# Patient Record
Sex: Male | Born: 1960 | Race: White | Hispanic: No | Marital: Married | Smoking: Never smoker
Health system: Southern US, Community
[De-identification: ages and names within clinical notes are randomized; demographics above are authoritative.]

## PROBLEM LIST (undated history)

## (undated) DIAGNOSIS — J45909 Unspecified asthma, uncomplicated: Secondary | ICD-10-CM

## (undated) DIAGNOSIS — T7840XA Allergy, unspecified, initial encounter: Secondary | ICD-10-CM

## (undated) DIAGNOSIS — E079 Disorder of thyroid, unspecified: Secondary | ICD-10-CM

## (undated) HISTORY — PX: APPENDECTOMY: SHX54

## (undated) HISTORY — DX: Allergy, unspecified, initial encounter: T78.40XA

## (undated) HISTORY — DX: Disorder of thyroid, unspecified: E07.9

## (undated) HISTORY — PX: HERNIA REPAIR: SHX51

## (undated) HISTORY — DX: Unspecified asthma, uncomplicated: J45.909

---

## 1998-12-10 ENCOUNTER — Ambulatory Visit (HOSPITAL_COMMUNITY): Admission: RE | Admit: 1998-12-10 | Discharge: 1998-12-10 | Payer: Self-pay | Admitting: Gastroenterology

## 1998-12-10 ENCOUNTER — Encounter (INDEPENDENT_AMBULATORY_CARE_PROVIDER_SITE_OTHER): Payer: Self-pay | Admitting: Specialist

## 2002-07-13 ENCOUNTER — Encounter: Payer: Self-pay | Admitting: Gastroenterology

## 2002-07-13 ENCOUNTER — Encounter: Admission: RE | Admit: 2002-07-13 | Discharge: 2002-07-13 | Payer: Self-pay | Admitting: Gastroenterology

## 2002-08-03 ENCOUNTER — Encounter: Payer: Self-pay | Admitting: Internal Medicine

## 2002-08-03 ENCOUNTER — Encounter: Admission: RE | Admit: 2002-08-03 | Discharge: 2002-08-03 | Payer: Self-pay | Admitting: Internal Medicine

## 2003-01-30 ENCOUNTER — Encounter: Payer: Self-pay | Admitting: Internal Medicine

## 2003-01-30 ENCOUNTER — Inpatient Hospital Stay (HOSPITAL_COMMUNITY): Admission: AD | Admit: 2003-01-30 | Discharge: 2003-01-31 | Payer: Self-pay | Admitting: Internal Medicine

## 2006-04-27 ENCOUNTER — Ambulatory Visit: Payer: Self-pay | Admitting: Gastroenterology

## 2006-06-03 ENCOUNTER — Emergency Department (HOSPITAL_COMMUNITY): Admission: EM | Admit: 2006-06-03 | Discharge: 2006-06-03 | Payer: Self-pay | Admitting: Emergency Medicine

## 2006-06-24 ENCOUNTER — Ambulatory Visit: Payer: Self-pay | Admitting: Gastroenterology

## 2006-06-29 ENCOUNTER — Ambulatory Visit: Payer: Self-pay | Admitting: Gastroenterology

## 2006-08-03 ENCOUNTER — Ambulatory Visit: Payer: Self-pay | Admitting: Gastroenterology

## 2007-03-17 ENCOUNTER — Ambulatory Visit: Payer: Self-pay | Admitting: Gastroenterology

## 2007-05-19 ENCOUNTER — Ambulatory Visit: Payer: Self-pay | Admitting: Gastroenterology

## 2007-05-26 ENCOUNTER — Ambulatory Visit: Payer: Self-pay | Admitting: Gastroenterology

## 2007-06-23 ENCOUNTER — Ambulatory Visit: Payer: Self-pay | Admitting: Gastroenterology

## 2007-07-04 ENCOUNTER — Ambulatory Visit (HOSPITAL_COMMUNITY): Admission: RE | Admit: 2007-07-04 | Discharge: 2007-07-04 | Payer: Self-pay | Admitting: Gastroenterology

## 2007-07-05 ENCOUNTER — Ambulatory Visit: Payer: Self-pay | Admitting: Gastroenterology

## 2007-08-04 ENCOUNTER — Ambulatory Visit: Payer: Self-pay | Admitting: Gastroenterology

## 2007-09-01 ENCOUNTER — Ambulatory Visit: Payer: Self-pay | Admitting: Gastroenterology

## 2007-09-29 ENCOUNTER — Ambulatory Visit: Payer: Self-pay | Admitting: Gastroenterology

## 2007-10-27 ENCOUNTER — Ambulatory Visit: Payer: Self-pay | Admitting: Gastroenterology

## 2007-11-24 ENCOUNTER — Ambulatory Visit: Payer: Self-pay | Admitting: Gastroenterology

## 2007-12-09 ENCOUNTER — Emergency Department (HOSPITAL_COMMUNITY): Admission: EM | Admit: 2007-12-09 | Discharge: 2007-12-09 | Payer: Self-pay | Admitting: Emergency Medicine

## 2008-01-26 ENCOUNTER — Ambulatory Visit: Payer: Self-pay | Admitting: Gastroenterology
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
## 2008-04-26 ENCOUNTER — Ambulatory Visit: Payer: Self-pay | Admitting: Gastroenterology

## 2009-03-23 IMAGING — US US ABDOMEN COMPLETE
1 series · 13 of 25 positions shown · non-contrast
Comparison: Ultrasound, 07/13/02.

CLINICAL DATA: Hepatitis C.  Evaluate for gallstones.
 ABDOMEN ULTRASOUND COMPLETE ? 07/04/07:
TECHNIQUE: Complete abdominal ultrasound examination was performed including evaluation of the liver, gallbladder, bile ducts, pancreas, kidneys, spleen, IVC, and abdominal aorta.

[Series 1: unknown · 0.35mm/px · 13 of 57 slices shown]
[im 1/57]
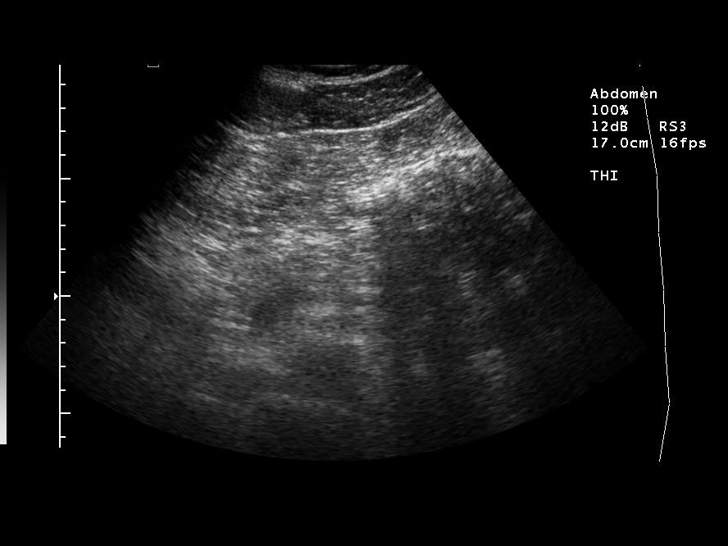
[im 5/57]
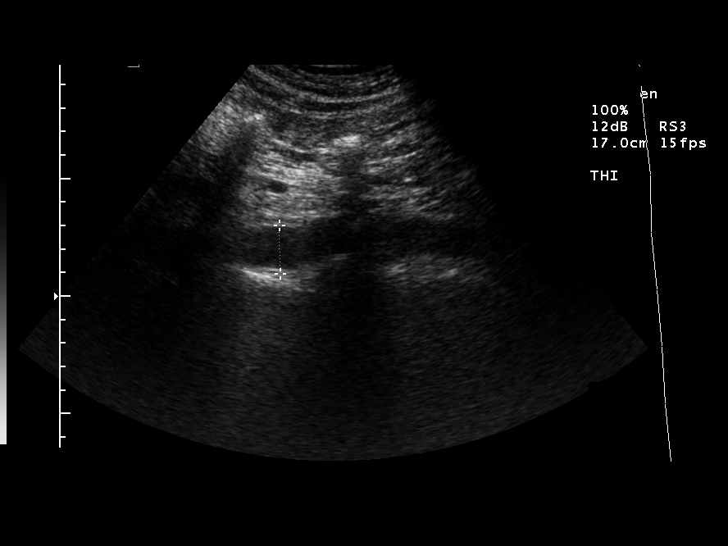
[im 10/57]
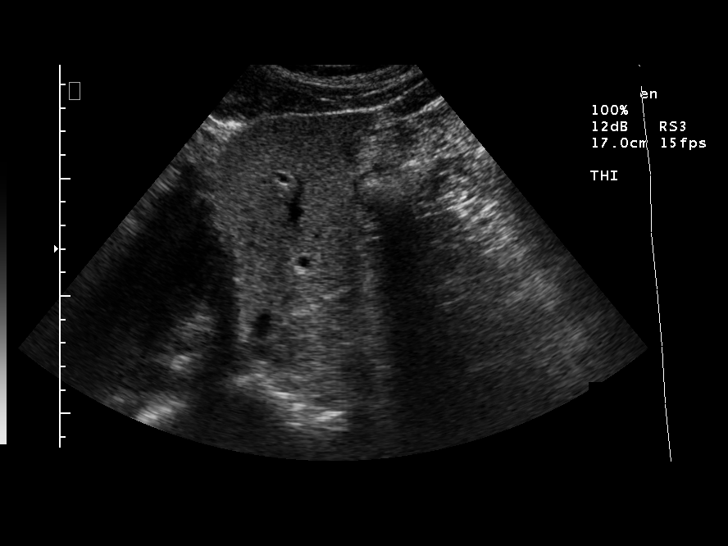
[im 15/57]
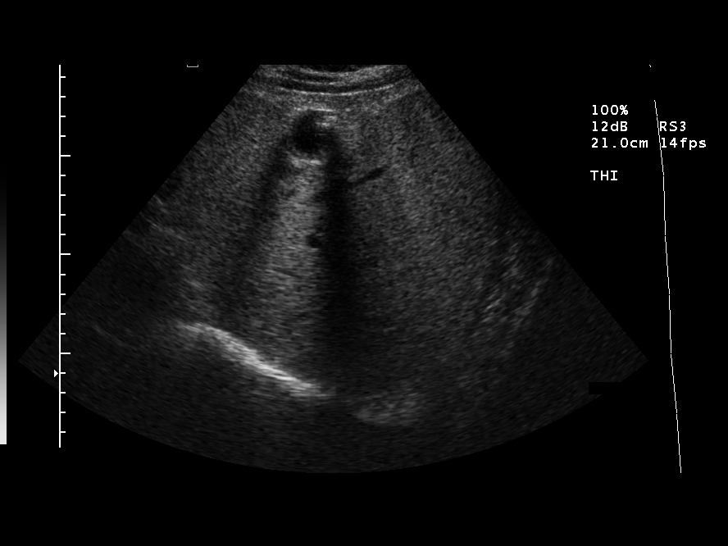
[im 19/57]
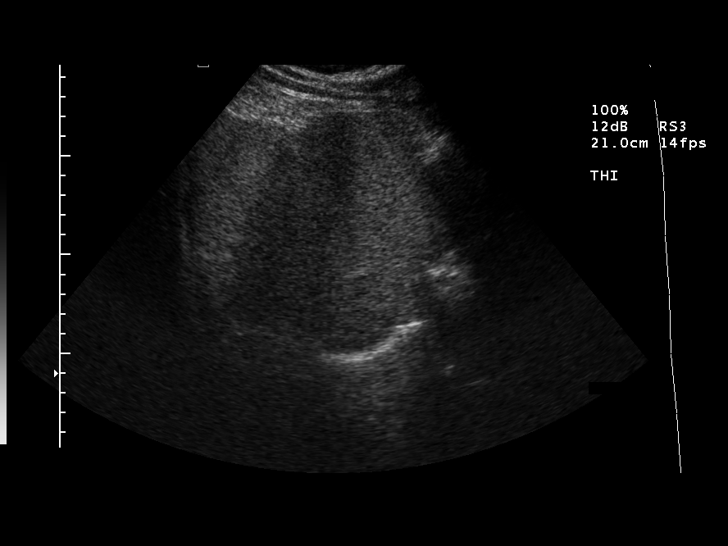
[im 24/57]
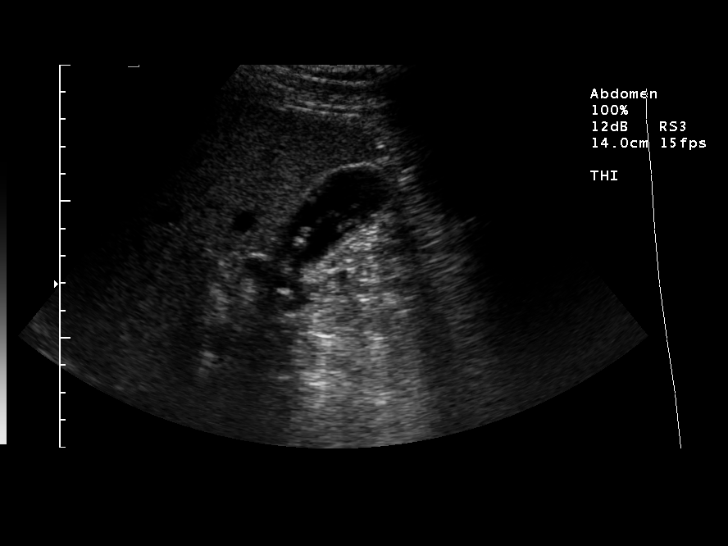
[im 29/57]
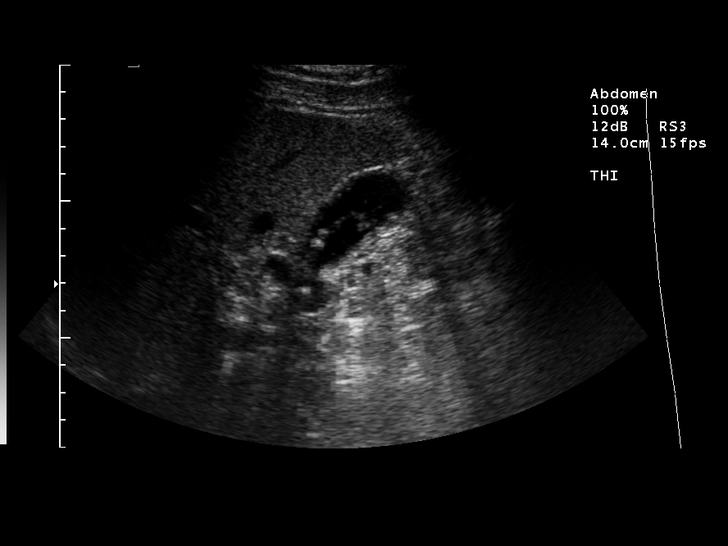
[im 33/57]
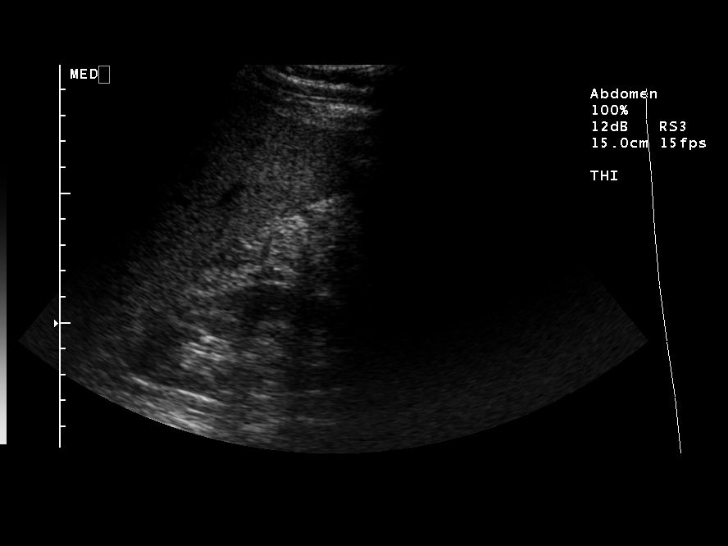
[im 38/57]
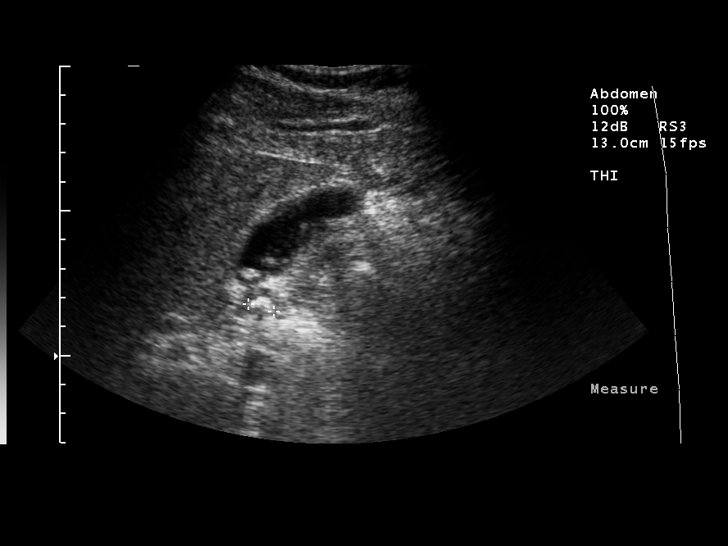
[im 43/57]
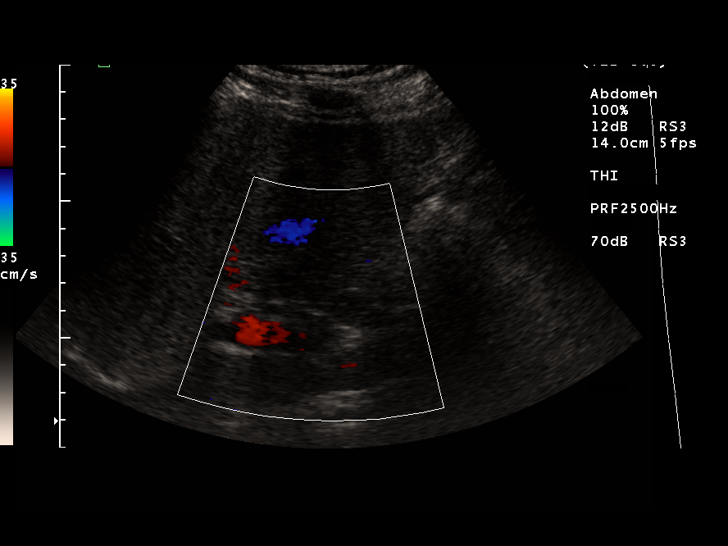
[im 47/57]
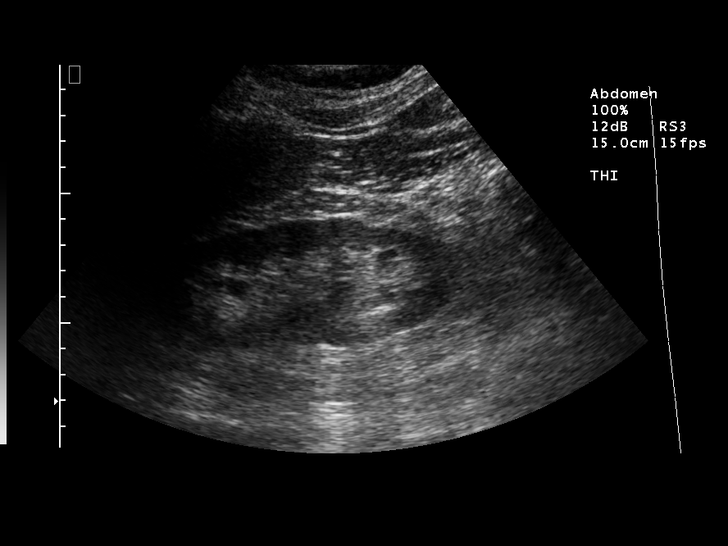
[im 52/57]
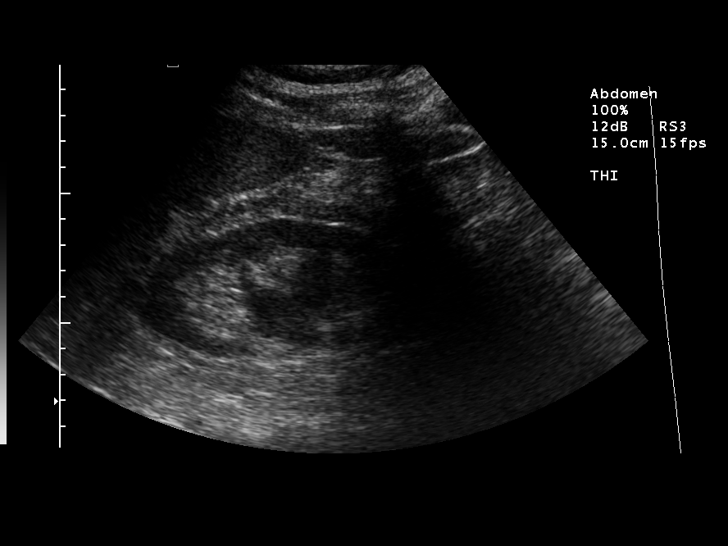
[im 57/57]
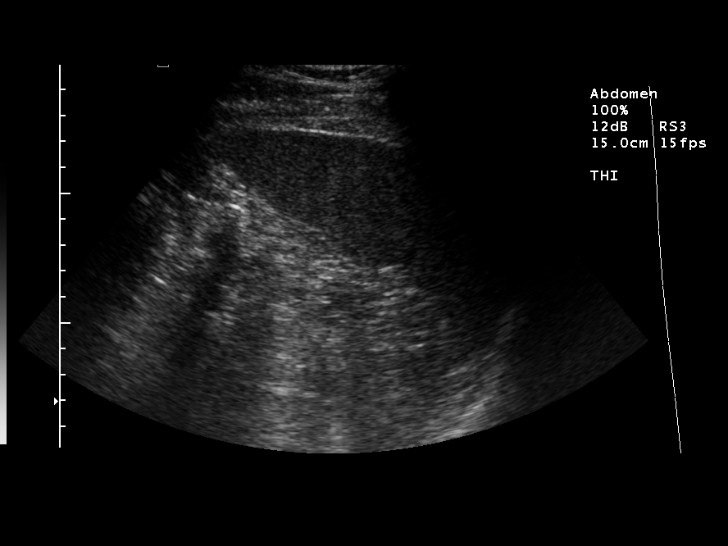

[13 of 25 positions shown; findings below may reference images not displayed]

FINDINGS: As noted on the study July 2002, multiple gallstones are noted within the gallbladder.  One remains in the neck of the gallbladder.  The gallbladder is minimally prominent but no pericholecystic fluid is seen and no pain is present over the gallbladder directly.  The liver is echogenic consistent with fatty infiltration.  The common bile duct is normal measuring 4.5 mm in diameter.  The IVC and pancreas appear normal with the spleen prominent measuring 13.6 cm sagittally.  No hydronephrosis is noted. The right kidney measures 10.2 cm sagittally with the left kidney measuring 12.0 cm.  The abdominal aorta is normal in caliber.
IMPRESSION: 1.  Multiple gallstones, one of which appears to remain in the neck of the gallbladder. The gallbladder wall is slightly prominent but no pain is present. 
 2.  Fatty infiltration of the liver.
 3.  Spleen upper normal.

## 2010-03-27 ENCOUNTER — Ambulatory Visit (HOSPITAL_COMMUNITY): Admission: RE | Admit: 2010-03-27 | Discharge: 2010-03-27 | Payer: Self-pay | Admitting: Gastroenterology

## 2010-09-26 NOTE — H&P (Signed)
NAME:  Justin Maddox, Justin Maddox                       ACCOUNT NO.:  [id number]   MEDICAL RECORD NO.:  [id number]                   PATIENT TYPE:  INP   LOCATION:  4728                                 FACILITY:  [facility]   PHYSICIAN:  Georgann Housekeeper, M.D.                 DATE OF BIRTH:  [birth year]   DATE OF ADMISSION:  01/30/2003  DATE OF DISCHARGE:                                HISTORY & PHYSICAL   CHIEF COMPLAINT:  Chest pain and left arm pain.   HISTORY OF PRESENT ILLNESS:  [age]-year-old male with history of mild  hyperlipidemia, chronic hepatitis C, anxiety, who came in with an episode of  chest pain that started today, around midmorning.  He said he a usual day  when he woke up, had breakfast and then later in the day, started having  some discomfort; he did not pay attention initially, had thought it was a  little bit of indigestion and the pain came on and off, is substernal, feels  like a pressure, a little bit of shortness of breath with this; no nausea or  vomiting, no diaphoresis.  He started having later, left arm discomfort and  came to the clinic with some discomfort in the left side of the chest. He  said he was feeling better and in the clinic, he had some EKG changes  laterally and ischemic, admitted for further evaluation.   PAST MEDICAL HISTORY:  1. Hypothyroidism.  2. Chronic hepatitis C.  3. Mild asthma and allergy.  4. Anxiety disorder.  5. Occasional knee problem.   MEDICATIONS:  1. Wellbutrin SR 150 mg b.i.d.  2. Serevent inhaler as needed (p.r.n.).  3. Synthroid 0.05 mg daily.   ALLERGIES:  Allergies to PENICILLIN and FLOXINS give him headache.   PAST SURGICAL HISTORY:  1. Right inguinal hernia repair.  2. Status post appendectomy.  3. Status post hemorrhoidectomy.   FAMILY HISTORY:  Negative for coronary artery disease.   SOCIAL HISTORY:  No tobacco, no alcohol and married.  He owns his own  business, Orthoptist, and fairly physically active; he  plays softball and  baseball regularly.   PHYSICAL EXAMINATION:  VITAL SIGNS:  Temperature 98.1, blood pressure  130/80, pulse 72, respirations 16.  GENERAL:  Well-appearing male in no acute distress.  LUNGS:  Lungs were clear.  HEENT:  Oropharynx was clear.  NECK:  No JVD or bruits.  CARDIAC:  Regular, S1 and S2, without any murmurs.  ABDOMEN:  Abdomen was soft without any tenderness.  EXTREMITIES:  There was no edema.   LABORATORY AND ACCESSORY CLINICAL DATA:  EKG showed normal sinus rhythm with  53 beats, with lateral ischemic inverted T waves in lateral leads, new since  1995 EKG.   Lab work and x-rays are pending.   IMPRESSION:  [age]-year-old male with mild hyperlipidemia, with  chronic hepatitis C and comes in with chest pain and left arm pain on  and  off during the day with some electrocardiographic changes.   PLAN:  1. Chest pain:  We will admit for telemetry, rule him out for myocardial     infarction, rule out for unstable angina, check some cardiac enzymes and     chest x-ray; put him on Lovenox, aspirin and beta blocker.  If he still     has the discomfort in the chest or arm, we might consider nitroglycerin     drip.  Cardiology consult.  He probably will need a cardiac stress test,     if he rules out.  2. Anxiety:  Continue on Wellbutrin.  3. Hypothyroid:  Continue on Synthroid.  4. I discussed this with cardiology for a consult.                                                Georgann Housekeeper, M.D.    Arliss Journey  D:  01/30/2003  T:  01/31/2003  Job:  [id number]

## 2011-02-27 ENCOUNTER — Emergency Department (HOSPITAL_COMMUNITY)
Admission: EM | Admit: 2011-02-27 | Discharge: 2011-02-27 | Disposition: A | Discharge status: Home / Self Care | Payer: BC Managed Care – PPO | Attending: Emergency Medicine | Admitting: Emergency Medicine

## 2011-02-27 DIAGNOSIS — R1013 Epigastric pain: Secondary | ICD-10-CM | POA: Insufficient documentation

## 2011-02-27 DIAGNOSIS — R110 Nausea: Secondary | ICD-10-CM | POA: Insufficient documentation

## 2011-02-27 DIAGNOSIS — J45909 Unspecified asthma, uncomplicated: Secondary | ICD-10-CM | POA: Insufficient documentation

## 2011-02-27 DIAGNOSIS — K8020 Calculus of gallbladder without cholecystitis without obstruction: Secondary | ICD-10-CM | POA: Insufficient documentation

## 2011-02-27 DIAGNOSIS — E039 Hypothyroidism, unspecified: Secondary | ICD-10-CM | POA: Insufficient documentation

## 2011-02-27 LAB — CBC
HCT: 44.7 % (ref 39.0–52.0)
MCH: 34.9 pg — ABNORMAL HIGH (ref 26.0–34.0)
MCV: 97 fL (ref 78.0–100.0)
Platelets: 171 10*3/uL (ref 150–400)
RDW: 12.1 % (ref 11.5–15.5)
WBC: 9.2 10*3/uL (ref 4.0–10.5)

## 2011-02-27 LAB — DIFFERENTIAL
Basophils Absolute: 0 10*3/uL (ref 0.0–0.1)
Basophils Relative: 0 % (ref 0–1)
Eosinophils Relative: 2 % (ref 0–5)
Lymphocytes Relative: 18 % (ref 12–46)
Monocytes Absolute: 0.7 10*3/uL (ref 0.1–1.0)
Monocytes Relative: 8 % (ref 3–12)
Neutro Abs: 6.7 10*3/uL (ref 1.7–7.7)

## 2011-02-27 LAB — COMPREHENSIVE METABOLIC PANEL
Albumin: 4.5 g/dL (ref 3.5–5.2)
Alkaline Phosphatase: 51 U/L (ref 39–117)
BUN: 25 mg/dL — ABNORMAL HIGH (ref 6–23)
Chloride: 102 mEq/L (ref 96–112)
Glucose, Bld: 100 mg/dL — ABNORMAL HIGH (ref 70–99)
Sodium: 137 mEq/L (ref 135–145)
Total Bilirubin: 0.4 mg/dL (ref 0.3–1.2)

## 2012-02-11 ENCOUNTER — Ambulatory Visit (HOSPITAL_COMMUNITY)
Admission: RE | Admit: 2012-02-11 | Discharge: 2012-02-11 | Disposition: A | Discharge status: Home / Self Care | Payer: BC Managed Care – PPO | Source: Ambulatory Visit | Attending: Internal Medicine | Admitting: Internal Medicine

## 2012-02-11 DIAGNOSIS — J45909 Unspecified asthma, uncomplicated: Secondary | ICD-10-CM | POA: Insufficient documentation

## 2012-02-11 MED ORDER — ALBUTEROL SULFATE (5 MG/ML) 0.5% IN NEBU
2.5000 mg | INHALATION_SOLUTION | Freq: Once | RESPIRATORY_TRACT | Status: AC
  Administered 2012-02-11: 2.5 mg via RESPIRATORY_TRACT

## 2014-12-27 ENCOUNTER — Ambulatory Visit (INDEPENDENT_AMBULATORY_CARE_PROVIDER_SITE_OTHER): Payer: BLUE CROSS/BLUE SHIELD

## 2014-12-27 ENCOUNTER — Ambulatory Visit (INDEPENDENT_AMBULATORY_CARE_PROVIDER_SITE_OTHER): Payer: BLUE CROSS/BLUE SHIELD | Admitting: Family Medicine

## 2014-12-27 VITALS — BP 136/74 | HR 59 | Temp 98.3°F | Resp 18 | Ht 74.0 in | Wt 204.0 lb

## 2014-12-27 DIAGNOSIS — M7989 Other specified soft tissue disorders: Secondary | ICD-10-CM | POA: Diagnosis not present

## 2014-12-27 DIAGNOSIS — M79672 Pain in left foot: Secondary | ICD-10-CM

## 2014-12-27 DIAGNOSIS — S92532A Displaced fracture of distal phalanx of left lesser toe(s), initial encounter for closed fracture: Secondary | ICD-10-CM

## 2014-12-27 DIAGNOSIS — S92912A Unspecified fracture of left toe(s), initial encounter for closed fracture: Secondary | ICD-10-CM

## 2014-12-27 MED ORDER — NAPROXEN 500 MG PO TABS: 500.0000 mg | ORAL_TABLET | Freq: Two times a day (BID) | ORAL | Status: AC

## 2014-12-27 NOTE — Progress Notes (Signed)
Chief Complaint:  Chief Complaint  Patient presents with  . Foot Swelling    left foot, started tuesday     HPI: Justin Maddox is a 54 y.o. male who reports to [facility] today complaining of left foot pain after jumping out of bed of pickup truck throwing things in dumpster and attacked by bees, and so jumped off truck bed and landed on his foot and injured his foot. + foot swelling,  Some numbness and tinglign and throbbing and and has been putting weight on it, he ahs been walkin on it. He has no pain on the lateral side of his foot, it is primarily on his medial  Side.  Has tried icing it and elevating it.   Past Medical History  Diagnosis Date  . Allergy   . Asthma   . Thyroid disease    Past Surgical History  Procedure Laterality Date  . Hernia repair    . Appendectomy     Social History   Social History  . Marital Status: Married    Spouse Name: N/A  . Number of Children: N/A  . Years of Education: N/A   Social History Main Topics  . Smoking status: Never Smoker   . Smokeless tobacco: None  . Alcohol Use: 0.0 oz/week    0 Standard drinks or equivalent per week  . Drug Use: None  . Sexual Activity: Not Asked   Other Topics Concern  . None   Social History Narrative  . None   No family history on file. Allergies  Allergen Reactions  . Penicillins Swelling   Prior to Admission medications   Medication Sig Start Date End Date Taking? Authorizing Provider  Levothyroxine Sodium (SYNTHROID PO) Take by mouth.   Yes Historical Provider, MD     ROS: The patient denies fevers, chills, night sweats, unintentional weight loss, chest pain, palpitations, wheezing, dyspnea on exertion, nausea, vomiting, abdominal pain, dysuria, hematuria, melena  All other systems have been reviewed and were otherwise negative with the exception of those mentioned in the HPI and as above.    PHYSICAL EXAM: Filed Vitals:   12/27/14 1230  BP: 136/74  Pulse: 59  Temp: 98.3  F (36.8 C)  Resp: 18   Body mass index is 26.18 kg/(m^2).   General: Alert, no acute distress HEENT:  Normocephalic, atraumatic, oropharynx patent. EOMI, PERRLA Cardiovascular:  Regular rate and rhythm, no rubs murmurs or gallops.  No Carotid bruits, radial pulse intact. No pedal edema.  Respiratory: Clear to auscultation bilaterally.  No wheezes, rales, or rhonchi.  No cyanosis, no use of accessory musculature Abdominal: No organomegaly, abdomen is soft and non-tender, positive bowel sounds. No masses. Skin: No rashes. Neurologic: Facial musculature symmetric. Psychiatric: Patient acts appropriately throughout our interaction. Lymphatic: No cervical or submandibular lymphadenopathy Musculoskeletal: Gait minimally antalgic + tender medial forefoot, nontender 5th toe + selling, + DP and PTA pulse Minimal ecchymosis Full ROM, sensation intact 5/5 strength   LABS: Results for orders placed or performed during the hospital encounter of 02/27/11  Differential  Result Value Ref Range   Neutrophils Relative % 72 43 - 77 %   Neutro Abs 6.7 1.7 - 7.7 K/uL   Lymphocytes Relative 18 12 - 46 %   Lymphs Abs 1.7 0.7 - 4.0 K/uL   Monocytes Relative 8 3 - 12 %   Monocytes Absolute 0.7 0.1 - 1.0 K/uL   Eosinophils Relative 2 0 - 5 %   Eosinophils Absolute  0.2 0.0 - 0.7 K/uL   Basophils Relative 0 0 - 1 %   Basophils Absolute 0.0 0.0 - 0.1 K/uL  CBC  Result Value Ref Range   WBC 9.2 4.0 - 10.5 K/uL   RBC 4.61 4.22 - 5.81 MIL/uL   Hemoglobin 16.1 13.0 - 17.0 g/dL   HCT 04.5 40.9 - 81.1 %   MCV 97.0 78.0 - 100.0 fL   MCH 34.9 (H) 26.0 - 34.0 pg   MCHC 36.0 30.0 - 36.0 g/dL   RDW 91.4 78.2 - 95.6 %   Platelets 171 150 - 400 K/uL  Comprehensive metabolic panel  Result Value Ref Range   Sodium 137 135 - 145 mEq/L   Potassium 4.2 3.5 - 5.1 mEq/L   Chloride 102 96 - 112 mEq/L   CO2 23 19 - 32 mEq/L   Glucose, Bld 100 (H) 70 - 99 mg/dL   BUN 25 (H) 6 - 23 mg/dL   Creatinine, Ser 2.13  0.50 - 1.35 mg/dL   Calcium 9.9 8.4 - 08.6 mg/dL   Total Protein 7.5 6.0 - 8.3 g/dL   Albumin 4.5 3.5 - 5.2 g/dL   AST 44 (H) 0 - 37 U/L   ALT 29 0 - 53 U/L   Alkaline Phosphatase 51 39 - 117 U/L   Total Bilirubin 0.4 0.3 - 1.2 mg/dL   GFR calc non Af Amer 71 (L) >90 mL/min   GFR calc Af Amer 82 (L) >90 mL/min  Lipase, blood  Result Value Ref Range   Lipase 37 11 - 59 U/L     EKG/XRAY:   Primary read interpreted by Dr. Conley Rolls at [facility]. + distal phalanges fracture of 5th toe, please comment if there is any fracture near sesamoid bone, lateral view shows 2 small opaque fragments    ASSESSMENT/PLAN: Encounter Diagnoses  Name Primary?  . Left foot pain Yes  . Toe fracture, left, closed, initial encounter   . Foot swelling    He has medial midfoot pain, he denies having any pain on his left 5th toe, he thinks he may have broken this several months earlier when he stubbed it on something, it does not hurt at all  On exam the lateral side of the foot is not affected. He does not want a post op shoe He will be nonweightbearing for 1 week , RICE IF no improvement then need to get repeat xrays, Sandrea Hughs, stress fracture etc may notbe noticeable on xray until later or may need advance imaging.  He has crutches at home, he has a flat velcro post op like shoe is currently wearing and so will use that  Official xrays d/w patient: IMPRESSION: Slight displaced fracture of the distal tuft of the left fifth digit, otherwise negative exam.   Electronically Signed  By: Maisie Fus Register  On: 12/27/2014 13:08   Gross sideeffects, risk and benefits, and alternatives of medications d/w patient. Patient is aware that all medications have potential sideeffects and we are unable to predict every sideeffect or drug-drug interaction that may occur.  Yobany Vroom DO  01/01/2015 7:53 AM

## 2016-02-17 DIAGNOSIS — E039 Hypothyroidism, unspecified: Secondary | ICD-10-CM | POA: Diagnosis not present

## 2016-02-17 DIAGNOSIS — R7303 Prediabetes: Secondary | ICD-10-CM | POA: Diagnosis not present

## 2016-02-17 DIAGNOSIS — Z0000 Encounter for general adult medical examination without abnormal findings: Secondary | ICD-10-CM | POA: Diagnosis not present

## 2016-02-17 DIAGNOSIS — Z125 Encounter for screening for malignant neoplasm of prostate: Secondary | ICD-10-CM | POA: Diagnosis not present

## 2016-02-17 DIAGNOSIS — M109 Gout, unspecified: Secondary | ICD-10-CM | POA: Diagnosis not present

## 2016-02-17 DIAGNOSIS — Z1389 Encounter for screening for other disorder: Secondary | ICD-10-CM | POA: Diagnosis not present

## 2016-04-09 DIAGNOSIS — D225 Melanocytic nevi of trunk: Secondary | ICD-10-CM | POA: Diagnosis not present

## 2016-04-09 DIAGNOSIS — L918 Other hypertrophic disorders of the skin: Secondary | ICD-10-CM | POA: Diagnosis not present

## 2016-04-09 DIAGNOSIS — L718 Other rosacea: Secondary | ICD-10-CM | POA: Diagnosis not present

## 2016-04-09 DIAGNOSIS — D2262 Melanocytic nevi of left upper limb, including shoulder: Secondary | ICD-10-CM | POA: Diagnosis not present

## 2016-04-09 DIAGNOSIS — D2261 Melanocytic nevi of right upper limb, including shoulder: Secondary | ICD-10-CM | POA: Diagnosis not present

## 2016-04-09 DIAGNOSIS — D2272 Melanocytic nevi of left lower limb, including hip: Secondary | ICD-10-CM | POA: Diagnosis not present

## 2017-01-19 DIAGNOSIS — R110 Nausea: Secondary | ICD-10-CM | POA: Diagnosis not present

## 2017-01-19 DIAGNOSIS — R634 Abnormal weight loss: Secondary | ICD-10-CM | POA: Diagnosis not present

## 2017-01-19 DIAGNOSIS — R630 Anorexia: Secondary | ICD-10-CM | POA: Diagnosis not present

## 2017-01-19 DIAGNOSIS — L299 Pruritus, unspecified: Secondary | ICD-10-CM | POA: Diagnosis not present

## 2017-01-19 DIAGNOSIS — B1920 Unspecified viral hepatitis C without hepatic coma: Secondary | ICD-10-CM | POA: Diagnosis not present

## 2017-01-27 DIAGNOSIS — R945 Abnormal results of liver function studies: Secondary | ICD-10-CM | POA: Diagnosis not present

## 2017-01-29 ENCOUNTER — Other Ambulatory Visit: Payer: Self-pay | Admitting: Internal Medicine

## 2017-01-29 DIAGNOSIS — R7989 Other specified abnormal findings of blood chemistry: Secondary | ICD-10-CM

## 2017-01-29 DIAGNOSIS — R945 Abnormal results of liver function studies: Principal | ICD-10-CM

## 2017-02-08 ENCOUNTER — Other Ambulatory Visit: Payer: Self-pay

## 2017-02-18 DIAGNOSIS — N183 Chronic kidney disease, stage 3 (moderate): Secondary | ICD-10-CM | POA: Diagnosis not present

## 2017-02-18 DIAGNOSIS — E785 Hyperlipidemia, unspecified: Secondary | ICD-10-CM | POA: Diagnosis not present

## 2017-02-18 DIAGNOSIS — N200 Calculus of kidney: Secondary | ICD-10-CM | POA: Diagnosis not present

## 2017-02-18 DIAGNOSIS — E039 Hypothyroidism, unspecified: Secondary | ICD-10-CM | POA: Diagnosis not present

## 2017-02-18 DIAGNOSIS — Z0000 Encounter for general adult medical examination without abnormal findings: Secondary | ICD-10-CM | POA: Diagnosis not present

## 2017-02-18 DIAGNOSIS — M109 Gout, unspecified: Secondary | ICD-10-CM | POA: Diagnosis not present

## 2017-02-18 DIAGNOSIS — D2261 Melanocytic nevi of right upper limb, including shoulder: Secondary | ICD-10-CM | POA: Diagnosis not present

## 2017-02-18 DIAGNOSIS — D225 Melanocytic nevi of trunk: Secondary | ICD-10-CM | POA: Diagnosis not present

## 2017-02-18 DIAGNOSIS — R740 Nonspecific elevation of levels of transaminase and lactic acid dehydrogenase [LDH]: Secondary | ICD-10-CM | POA: Diagnosis not present

## 2017-02-18 DIAGNOSIS — R7303 Prediabetes: Secondary | ICD-10-CM | POA: Diagnosis not present

## 2017-02-18 DIAGNOSIS — D2262 Melanocytic nevi of left upper limb, including shoulder: Secondary | ICD-10-CM | POA: Diagnosis not present

## 2017-02-18 DIAGNOSIS — J4520 Mild intermittent asthma, uncomplicated: Secondary | ICD-10-CM | POA: Diagnosis not present

## 2017-02-18 DIAGNOSIS — Z125 Encounter for screening for malignant neoplasm of prostate: Secondary | ICD-10-CM | POA: Diagnosis not present

## 2017-02-18 DIAGNOSIS — L821 Other seborrheic keratosis: Secondary | ICD-10-CM | POA: Diagnosis not present

## 2017-11-01 DIAGNOSIS — L281 Prurigo nodularis: Secondary | ICD-10-CM | POA: Diagnosis not present

## 2018-02-24 DIAGNOSIS — D2372 Other benign neoplasm of skin of left lower limb, including hip: Secondary | ICD-10-CM | POA: Diagnosis not present

## 2018-02-24 DIAGNOSIS — L570 Actinic keratosis: Secondary | ICD-10-CM | POA: Diagnosis not present

## 2018-02-24 DIAGNOSIS — L821 Other seborrheic keratosis: Secondary | ICD-10-CM | POA: Diagnosis not present

## 2018-02-24 DIAGNOSIS — D225 Melanocytic nevi of trunk: Secondary | ICD-10-CM | POA: Diagnosis not present

## 2018-03-11 DIAGNOSIS — Z0000 Encounter for general adult medical examination without abnormal findings: Secondary | ICD-10-CM | POA: Diagnosis not present

## 2018-03-11 DIAGNOSIS — R7303 Prediabetes: Secondary | ICD-10-CM | POA: Diagnosis not present

## 2018-03-11 DIAGNOSIS — M109 Gout, unspecified: Secondary | ICD-10-CM | POA: Diagnosis not present

## 2018-03-11 DIAGNOSIS — B1920 Unspecified viral hepatitis C without hepatic coma: Secondary | ICD-10-CM | POA: Diagnosis not present

## 2018-03-11 DIAGNOSIS — Z13220 Encounter for screening for lipoid disorders: Secondary | ICD-10-CM | POA: Diagnosis not present

## 2018-03-11 DIAGNOSIS — N182 Chronic kidney disease, stage 2 (mild): Secondary | ICD-10-CM | POA: Diagnosis not present

## 2018-03-11 DIAGNOSIS — Z125 Encounter for screening for malignant neoplasm of prostate: Secondary | ICD-10-CM | POA: Diagnosis not present

## 2018-03-11 DIAGNOSIS — E039 Hypothyroidism, unspecified: Secondary | ICD-10-CM | POA: Diagnosis not present

## 2018-07-28 ENCOUNTER — Other Ambulatory Visit: Payer: Self-pay | Admitting: Internal Medicine

## 2018-07-28 ENCOUNTER — Ambulatory Visit
Admission: RE | Admit: 2018-07-28 | Discharge: 2018-07-28 | Disposition: A | Discharge status: Home / Self Care | Payer: Self-pay | Source: Ambulatory Visit | Attending: Internal Medicine | Admitting: Internal Medicine

## 2018-07-28 DIAGNOSIS — R52 Pain, unspecified: Secondary | ICD-10-CM

## 2018-07-28 DIAGNOSIS — M25521 Pain in right elbow: Secondary | ICD-10-CM | POA: Diagnosis not present

## 2018-07-28 DIAGNOSIS — M25511 Pain in right shoulder: Secondary | ICD-10-CM | POA: Diagnosis not present

## 2018-08-11 DIAGNOSIS — M25521 Pain in right elbow: Secondary | ICD-10-CM | POA: Diagnosis not present

## 2018-08-11 DIAGNOSIS — M7701 Medial epicondylitis, right elbow: Secondary | ICD-10-CM | POA: Diagnosis not present

## 2018-08-11 DIAGNOSIS — M75121 Complete rotator cuff tear or rupture of right shoulder, not specified as traumatic: Secondary | ICD-10-CM | POA: Diagnosis not present

## 2018-08-11 DIAGNOSIS — M25511 Pain in right shoulder: Secondary | ICD-10-CM | POA: Diagnosis not present

## 2019-03-02 DIAGNOSIS — Z20828 Contact with and (suspected) exposure to other viral communicable diseases: Secondary | ICD-10-CM | POA: Diagnosis not present

## 2019-03-02 DIAGNOSIS — Z1159 Encounter for screening for other viral diseases: Secondary | ICD-10-CM | POA: Diagnosis not present

## 2019-03-09 DIAGNOSIS — D2261 Melanocytic nevi of right upper limb, including shoulder: Secondary | ICD-10-CM | POA: Diagnosis not present

## 2019-03-09 DIAGNOSIS — D2262 Melanocytic nevi of left upper limb, including shoulder: Secondary | ICD-10-CM | POA: Diagnosis not present

## 2019-03-09 DIAGNOSIS — L821 Other seborrheic keratosis: Secondary | ICD-10-CM | POA: Diagnosis not present

## 2019-03-09 DIAGNOSIS — D225 Melanocytic nevi of trunk: Secondary | ICD-10-CM | POA: Diagnosis not present

## 2019-03-17 DIAGNOSIS — R7303 Prediabetes: Secondary | ICD-10-CM | POA: Diagnosis not present

## 2019-03-17 DIAGNOSIS — B1920 Unspecified viral hepatitis C without hepatic coma: Secondary | ICD-10-CM | POA: Diagnosis not present

## 2019-03-17 DIAGNOSIS — Z1389 Encounter for screening for other disorder: Secondary | ICD-10-CM | POA: Diagnosis not present

## 2019-03-17 DIAGNOSIS — E039 Hypothyroidism, unspecified: Secondary | ICD-10-CM | POA: Diagnosis not present

## 2019-03-17 DIAGNOSIS — Z0000 Encounter for general adult medical examination without abnormal findings: Secondary | ICD-10-CM | POA: Diagnosis not present

## 2019-03-17 DIAGNOSIS — Z23 Encounter for immunization: Secondary | ICD-10-CM | POA: Diagnosis not present

## 2019-03-17 DIAGNOSIS — Z125 Encounter for screening for malignant neoplasm of prostate: Secondary | ICD-10-CM | POA: Diagnosis not present

## 2019-03-17 DIAGNOSIS — Z13220 Encounter for screening for lipoid disorders: Secondary | ICD-10-CM | POA: Diagnosis not present

## 2019-03-17 DIAGNOSIS — M109 Gout, unspecified: Secondary | ICD-10-CM | POA: Diagnosis not present

## 2019-04-10 DIAGNOSIS — Z1159 Encounter for screening for other viral diseases: Secondary | ICD-10-CM | POA: Diagnosis not present

## 2019-04-10 DIAGNOSIS — Z20828 Contact with and (suspected) exposure to other viral communicable diseases: Secondary | ICD-10-CM | POA: Diagnosis not present

## 2019-04-27 DIAGNOSIS — Z20828 Contact with and (suspected) exposure to other viral communicable diseases: Secondary | ICD-10-CM | POA: Diagnosis not present

## 2019-07-17 DIAGNOSIS — Z23 Encounter for immunization: Secondary | ICD-10-CM | POA: Diagnosis not present

## 2020-03-19 DIAGNOSIS — Z13220 Encounter for screening for lipoid disorders: Secondary | ICD-10-CM | POA: Diagnosis not present

## 2020-03-19 DIAGNOSIS — D225 Melanocytic nevi of trunk: Secondary | ICD-10-CM | POA: Diagnosis not present

## 2020-03-19 DIAGNOSIS — R7303 Prediabetes: Secondary | ICD-10-CM | POA: Diagnosis not present

## 2020-03-19 DIAGNOSIS — E559 Vitamin D deficiency, unspecified: Secondary | ICD-10-CM | POA: Diagnosis not present

## 2020-03-19 DIAGNOSIS — Z0000 Encounter for general adult medical examination without abnormal findings: Secondary | ICD-10-CM | POA: Diagnosis not present

## 2020-03-19 DIAGNOSIS — M109 Gout, unspecified: Secondary | ICD-10-CM | POA: Diagnosis not present

## 2020-03-19 DIAGNOSIS — Z23 Encounter for immunization: Secondary | ICD-10-CM | POA: Diagnosis not present

## 2020-03-19 DIAGNOSIS — D2272 Melanocytic nevi of left lower limb, including hip: Secondary | ICD-10-CM | POA: Diagnosis not present

## 2020-03-19 DIAGNOSIS — D2261 Melanocytic nevi of right upper limb, including shoulder: Secondary | ICD-10-CM | POA: Diagnosis not present

## 2020-03-19 DIAGNOSIS — D2262 Melanocytic nevi of left upper limb, including shoulder: Secondary | ICD-10-CM | POA: Diagnosis not present

## 2020-03-19 DIAGNOSIS — N182 Chronic kidney disease, stage 2 (mild): Secondary | ICD-10-CM | POA: Diagnosis not present

## 2020-03-19 DIAGNOSIS — E039 Hypothyroidism, unspecified: Secondary | ICD-10-CM | POA: Diagnosis not present

## 2020-03-19 DIAGNOSIS — J309 Allergic rhinitis, unspecified: Secondary | ICD-10-CM | POA: Diagnosis not present

## 2020-09-10 DIAGNOSIS — K648 Other hemorrhoids: Secondary | ICD-10-CM | POA: Diagnosis not present

## 2020-09-10 DIAGNOSIS — Z1211 Encounter for screening for malignant neoplasm of colon: Secondary | ICD-10-CM | POA: Diagnosis not present

## 2020-09-10 DIAGNOSIS — K5730 Diverticulosis of large intestine without perforation or abscess without bleeding: Secondary | ICD-10-CM | POA: Diagnosis not present

## 2020-09-10 DIAGNOSIS — D122 Benign neoplasm of ascending colon: Secondary | ICD-10-CM | POA: Diagnosis not present

## 2021-03-25 DIAGNOSIS — L814 Other melanin hyperpigmentation: Secondary | ICD-10-CM | POA: Diagnosis not present

## 2021-03-25 DIAGNOSIS — L570 Actinic keratosis: Secondary | ICD-10-CM | POA: Diagnosis not present

## 2021-03-25 DIAGNOSIS — D225 Melanocytic nevi of trunk: Secondary | ICD-10-CM | POA: Diagnosis not present

## 2021-03-28 DIAGNOSIS — Z23 Encounter for immunization: Secondary | ICD-10-CM | POA: Diagnosis not present

## 2021-03-28 DIAGNOSIS — Z13220 Encounter for screening for lipoid disorders: Secondary | ICD-10-CM | POA: Diagnosis not present

## 2021-03-28 DIAGNOSIS — Z125 Encounter for screening for malignant neoplasm of prostate: Secondary | ICD-10-CM | POA: Diagnosis not present

## 2021-03-28 DIAGNOSIS — Z0000 Encounter for general adult medical examination without abnormal findings: Secondary | ICD-10-CM | POA: Diagnosis not present

## 2021-03-28 DIAGNOSIS — R7303 Prediabetes: Secondary | ICD-10-CM | POA: Diagnosis not present

## 2021-03-28 DIAGNOSIS — M109 Gout, unspecified: Secondary | ICD-10-CM | POA: Diagnosis not present

## 2021-03-28 DIAGNOSIS — E039 Hypothyroidism, unspecified: Secondary | ICD-10-CM | POA: Diagnosis not present

## 2021-04-22 DIAGNOSIS — M25511 Pain in right shoulder: Secondary | ICD-10-CM | POA: Diagnosis not present

## 2021-05-20 DIAGNOSIS — M25511 Pain in right shoulder: Secondary | ICD-10-CM | POA: Diagnosis not present

## 2021-05-22 DIAGNOSIS — M25511 Pain in right shoulder: Secondary | ICD-10-CM | POA: Diagnosis not present

## 2021-05-27 DIAGNOSIS — M25511 Pain in right shoulder: Secondary | ICD-10-CM | POA: Diagnosis not present

## 2021-06-09 DIAGNOSIS — S46011A Strain of muscle(s) and tendon(s) of the rotator cuff of right shoulder, initial encounter: Secondary | ICD-10-CM | POA: Diagnosis not present

## 2021-06-09 DIAGNOSIS — M24111 Other articular cartilage disorders, right shoulder: Secondary | ICD-10-CM | POA: Diagnosis not present

## 2021-06-09 DIAGNOSIS — G8918 Other acute postprocedural pain: Secondary | ICD-10-CM | POA: Diagnosis not present

## 2021-06-09 DIAGNOSIS — M948X1 Other specified disorders of cartilage, shoulder: Secondary | ICD-10-CM | POA: Diagnosis not present

## 2021-06-09 DIAGNOSIS — M75111 Incomplete rotator cuff tear or rupture of right shoulder, not specified as traumatic: Secondary | ICD-10-CM | POA: Diagnosis not present

## 2021-06-09 DIAGNOSIS — M7521 Bicipital tendinitis, right shoulder: Secondary | ICD-10-CM | POA: Diagnosis not present

## 2021-06-09 DIAGNOSIS — M7541 Impingement syndrome of right shoulder: Secondary | ICD-10-CM | POA: Diagnosis not present

## 2021-06-09 DIAGNOSIS — S43431A Superior glenoid labrum lesion of right shoulder, initial encounter: Secondary | ICD-10-CM | POA: Diagnosis not present

## 2021-07-23 DIAGNOSIS — M25311 Other instability, right shoulder: Secondary | ICD-10-CM | POA: Diagnosis not present

## 2021-07-23 DIAGNOSIS — M25511 Pain in right shoulder: Secondary | ICD-10-CM | POA: Diagnosis not present

## 2021-07-23 DIAGNOSIS — Z4789 Encounter for other orthopedic aftercare: Secondary | ICD-10-CM | POA: Diagnosis not present

## 2021-07-25 DIAGNOSIS — M25311 Other instability, right shoulder: Secondary | ICD-10-CM | POA: Diagnosis not present

## 2021-07-25 DIAGNOSIS — Z4789 Encounter for other orthopedic aftercare: Secondary | ICD-10-CM | POA: Diagnosis not present

## 2021-07-25 DIAGNOSIS — M25511 Pain in right shoulder: Secondary | ICD-10-CM | POA: Diagnosis not present

## 2021-07-28 DIAGNOSIS — M25311 Other instability, right shoulder: Secondary | ICD-10-CM | POA: Diagnosis not present

## 2021-07-28 DIAGNOSIS — Z4789 Encounter for other orthopedic aftercare: Secondary | ICD-10-CM | POA: Diagnosis not present

## 2021-07-28 DIAGNOSIS — M25511 Pain in right shoulder: Secondary | ICD-10-CM | POA: Diagnosis not present

## 2021-08-01 DIAGNOSIS — M25311 Other instability, right shoulder: Secondary | ICD-10-CM | POA: Diagnosis not present

## 2021-08-01 DIAGNOSIS — Z4789 Encounter for other orthopedic aftercare: Secondary | ICD-10-CM | POA: Diagnosis not present

## 2021-08-01 DIAGNOSIS — M25511 Pain in right shoulder: Secondary | ICD-10-CM | POA: Diagnosis not present

## 2021-08-05 DIAGNOSIS — M25511 Pain in right shoulder: Secondary | ICD-10-CM | POA: Diagnosis not present

## 2021-08-05 DIAGNOSIS — Z4789 Encounter for other orthopedic aftercare: Secondary | ICD-10-CM | POA: Diagnosis not present

## 2021-08-05 DIAGNOSIS — M25311 Other instability, right shoulder: Secondary | ICD-10-CM | POA: Diagnosis not present

## 2021-08-08 DIAGNOSIS — M25311 Other instability, right shoulder: Secondary | ICD-10-CM | POA: Diagnosis not present

## 2021-08-08 DIAGNOSIS — M25511 Pain in right shoulder: Secondary | ICD-10-CM | POA: Diagnosis not present

## 2021-08-08 DIAGNOSIS — Z4789 Encounter for other orthopedic aftercare: Secondary | ICD-10-CM | POA: Diagnosis not present

## 2021-08-15 DIAGNOSIS — M25511 Pain in right shoulder: Secondary | ICD-10-CM | POA: Diagnosis not present

## 2021-08-15 DIAGNOSIS — Z4789 Encounter for other orthopedic aftercare: Secondary | ICD-10-CM | POA: Diagnosis not present

## 2021-08-15 DIAGNOSIS — M25311 Other instability, right shoulder: Secondary | ICD-10-CM | POA: Diagnosis not present

## 2021-08-19 DIAGNOSIS — M25511 Pain in right shoulder: Secondary | ICD-10-CM | POA: Diagnosis not present

## 2021-08-19 DIAGNOSIS — M25311 Other instability, right shoulder: Secondary | ICD-10-CM | POA: Diagnosis not present

## 2021-08-19 DIAGNOSIS — Z4789 Encounter for other orthopedic aftercare: Secondary | ICD-10-CM | POA: Diagnosis not present

## 2021-08-22 DIAGNOSIS — M25311 Other instability, right shoulder: Secondary | ICD-10-CM | POA: Diagnosis not present

## 2021-08-22 DIAGNOSIS — Z4789 Encounter for other orthopedic aftercare: Secondary | ICD-10-CM | POA: Diagnosis not present

## 2021-08-22 DIAGNOSIS — M25511 Pain in right shoulder: Secondary | ICD-10-CM | POA: Diagnosis not present

## 2021-08-26 DIAGNOSIS — M25511 Pain in right shoulder: Secondary | ICD-10-CM | POA: Diagnosis not present

## 2021-08-26 DIAGNOSIS — M25311 Other instability, right shoulder: Secondary | ICD-10-CM | POA: Diagnosis not present

## 2021-08-26 DIAGNOSIS — Z4789 Encounter for other orthopedic aftercare: Secondary | ICD-10-CM | POA: Diagnosis not present

## 2021-08-29 DIAGNOSIS — Z4789 Encounter for other orthopedic aftercare: Secondary | ICD-10-CM | POA: Diagnosis not present

## 2021-08-29 DIAGNOSIS — M25311 Other instability, right shoulder: Secondary | ICD-10-CM | POA: Diagnosis not present

## 2021-08-29 DIAGNOSIS — M25511 Pain in right shoulder: Secondary | ICD-10-CM | POA: Diagnosis not present

## 2021-09-01 DIAGNOSIS — M25511 Pain in right shoulder: Secondary | ICD-10-CM | POA: Diagnosis not present

## 2021-09-01 DIAGNOSIS — Z4789 Encounter for other orthopedic aftercare: Secondary | ICD-10-CM | POA: Diagnosis not present

## 2021-09-01 DIAGNOSIS — M25311 Other instability, right shoulder: Secondary | ICD-10-CM | POA: Diagnosis not present

## 2021-09-02 DIAGNOSIS — M24111 Other articular cartilage disorders, right shoulder: Secondary | ICD-10-CM | POA: Diagnosis not present

## 2021-09-05 DIAGNOSIS — Z4789 Encounter for other orthopedic aftercare: Secondary | ICD-10-CM | POA: Diagnosis not present

## 2021-09-05 DIAGNOSIS — M25311 Other instability, right shoulder: Secondary | ICD-10-CM | POA: Diagnosis not present

## 2021-09-05 DIAGNOSIS — M25511 Pain in right shoulder: Secondary | ICD-10-CM | POA: Diagnosis not present

## 2021-09-09 DIAGNOSIS — M25511 Pain in right shoulder: Secondary | ICD-10-CM | POA: Diagnosis not present

## 2021-09-09 DIAGNOSIS — Z4789 Encounter for other orthopedic aftercare: Secondary | ICD-10-CM | POA: Diagnosis not present

## 2021-09-09 DIAGNOSIS — M25311 Other instability, right shoulder: Secondary | ICD-10-CM | POA: Diagnosis not present

## 2021-09-12 DIAGNOSIS — M25311 Other instability, right shoulder: Secondary | ICD-10-CM | POA: Diagnosis not present

## 2021-09-12 DIAGNOSIS — M25511 Pain in right shoulder: Secondary | ICD-10-CM | POA: Diagnosis not present

## 2021-09-12 DIAGNOSIS — Z4789 Encounter for other orthopedic aftercare: Secondary | ICD-10-CM | POA: Diagnosis not present

## 2021-09-16 DIAGNOSIS — M25511 Pain in right shoulder: Secondary | ICD-10-CM | POA: Diagnosis not present

## 2021-09-16 DIAGNOSIS — M25311 Other instability, right shoulder: Secondary | ICD-10-CM | POA: Diagnosis not present

## 2021-09-16 DIAGNOSIS — Z4789 Encounter for other orthopedic aftercare: Secondary | ICD-10-CM | POA: Diagnosis not present

## 2021-09-18 DIAGNOSIS — Z4789 Encounter for other orthopedic aftercare: Secondary | ICD-10-CM | POA: Diagnosis not present

## 2021-09-18 DIAGNOSIS — M25511 Pain in right shoulder: Secondary | ICD-10-CM | POA: Diagnosis not present

## 2021-09-18 DIAGNOSIS — M25311 Other instability, right shoulder: Secondary | ICD-10-CM | POA: Diagnosis not present

## 2021-09-30 DIAGNOSIS — M24111 Other articular cartilage disorders, right shoulder: Secondary | ICD-10-CM | POA: Diagnosis not present

## 2021-10-02 DIAGNOSIS — M25511 Pain in right shoulder: Secondary | ICD-10-CM | POA: Diagnosis not present

## 2021-10-02 DIAGNOSIS — M25311 Other instability, right shoulder: Secondary | ICD-10-CM | POA: Diagnosis not present

## 2021-10-02 DIAGNOSIS — Z4789 Encounter for other orthopedic aftercare: Secondary | ICD-10-CM | POA: Diagnosis not present

## 2021-10-07 DIAGNOSIS — Z4789 Encounter for other orthopedic aftercare: Secondary | ICD-10-CM | POA: Diagnosis not present

## 2021-10-07 DIAGNOSIS — M25511 Pain in right shoulder: Secondary | ICD-10-CM | POA: Diagnosis not present

## 2021-10-07 DIAGNOSIS — M25311 Other instability, right shoulder: Secondary | ICD-10-CM | POA: Diagnosis not present

## 2021-10-14 DIAGNOSIS — M7541 Impingement syndrome of right shoulder: Secondary | ICD-10-CM | POA: Diagnosis not present

## 2021-10-17 DIAGNOSIS — M25311 Other instability, right shoulder: Secondary | ICD-10-CM | POA: Diagnosis not present

## 2021-10-17 DIAGNOSIS — M25511 Pain in right shoulder: Secondary | ICD-10-CM | POA: Diagnosis not present

## 2021-10-17 DIAGNOSIS — Z4789 Encounter for other orthopedic aftercare: Secondary | ICD-10-CM | POA: Diagnosis not present

## 2021-10-22 DIAGNOSIS — Z4789 Encounter for other orthopedic aftercare: Secondary | ICD-10-CM | POA: Diagnosis not present

## 2021-10-22 DIAGNOSIS — M25311 Other instability, right shoulder: Secondary | ICD-10-CM | POA: Diagnosis not present

## 2021-10-22 DIAGNOSIS — M25511 Pain in right shoulder: Secondary | ICD-10-CM | POA: Diagnosis not present

## 2021-10-24 DIAGNOSIS — M25311 Other instability, right shoulder: Secondary | ICD-10-CM | POA: Diagnosis not present

## 2021-10-24 DIAGNOSIS — Z4789 Encounter for other orthopedic aftercare: Secondary | ICD-10-CM | POA: Diagnosis not present

## 2021-10-24 DIAGNOSIS — M25511 Pain in right shoulder: Secondary | ICD-10-CM | POA: Diagnosis not present

## 2021-10-27 DIAGNOSIS — Z4789 Encounter for other orthopedic aftercare: Secondary | ICD-10-CM | POA: Diagnosis not present

## 2021-10-27 DIAGNOSIS — M25311 Other instability, right shoulder: Secondary | ICD-10-CM | POA: Diagnosis not present

## 2021-10-27 DIAGNOSIS — M25511 Pain in right shoulder: Secondary | ICD-10-CM | POA: Diagnosis not present

## 2021-10-30 DIAGNOSIS — M25511 Pain in right shoulder: Secondary | ICD-10-CM | POA: Diagnosis not present

## 2021-10-30 DIAGNOSIS — M25311 Other instability, right shoulder: Secondary | ICD-10-CM | POA: Diagnosis not present

## 2021-10-30 DIAGNOSIS — Z4789 Encounter for other orthopedic aftercare: Secondary | ICD-10-CM | POA: Diagnosis not present

## 2021-11-03 DIAGNOSIS — M25311 Other instability, right shoulder: Secondary | ICD-10-CM | POA: Diagnosis not present

## 2021-11-03 DIAGNOSIS — M25511 Pain in right shoulder: Secondary | ICD-10-CM | POA: Diagnosis not present

## 2021-11-03 DIAGNOSIS — Z4789 Encounter for other orthopedic aftercare: Secondary | ICD-10-CM | POA: Diagnosis not present

## 2021-11-05 DIAGNOSIS — Z4789 Encounter for other orthopedic aftercare: Secondary | ICD-10-CM | POA: Diagnosis not present

## 2021-11-05 DIAGNOSIS — M25311 Other instability, right shoulder: Secondary | ICD-10-CM | POA: Diagnosis not present

## 2021-11-05 DIAGNOSIS — M25511 Pain in right shoulder: Secondary | ICD-10-CM | POA: Diagnosis not present

## 2021-11-14 DIAGNOSIS — M25511 Pain in right shoulder: Secondary | ICD-10-CM | POA: Diagnosis not present

## 2021-11-14 DIAGNOSIS — M25311 Other instability, right shoulder: Secondary | ICD-10-CM | POA: Diagnosis not present

## 2021-11-14 DIAGNOSIS — Z4789 Encounter for other orthopedic aftercare: Secondary | ICD-10-CM | POA: Diagnosis not present

## 2021-11-17 DIAGNOSIS — Z4789 Encounter for other orthopedic aftercare: Secondary | ICD-10-CM | POA: Diagnosis not present

## 2021-11-17 DIAGNOSIS — M25311 Other instability, right shoulder: Secondary | ICD-10-CM | POA: Diagnosis not present

## 2021-11-17 DIAGNOSIS — M25511 Pain in right shoulder: Secondary | ICD-10-CM | POA: Diagnosis not present

## 2021-11-19 DIAGNOSIS — Z4789 Encounter for other orthopedic aftercare: Secondary | ICD-10-CM | POA: Diagnosis not present

## 2021-11-19 DIAGNOSIS — M25311 Other instability, right shoulder: Secondary | ICD-10-CM | POA: Diagnosis not present

## 2021-11-19 DIAGNOSIS — M25511 Pain in right shoulder: Secondary | ICD-10-CM | POA: Diagnosis not present

## 2021-11-28 DIAGNOSIS — M25311 Other instability, right shoulder: Secondary | ICD-10-CM | POA: Diagnosis not present

## 2021-11-28 DIAGNOSIS — M25511 Pain in right shoulder: Secondary | ICD-10-CM | POA: Diagnosis not present

## 2021-11-28 DIAGNOSIS — Z4789 Encounter for other orthopedic aftercare: Secondary | ICD-10-CM | POA: Diagnosis not present

## 2021-12-01 DIAGNOSIS — M25311 Other instability, right shoulder: Secondary | ICD-10-CM | POA: Diagnosis not present

## 2021-12-01 DIAGNOSIS — M25511 Pain in right shoulder: Secondary | ICD-10-CM | POA: Diagnosis not present

## 2021-12-01 DIAGNOSIS — Z4789 Encounter for other orthopedic aftercare: Secondary | ICD-10-CM | POA: Diagnosis not present

## 2021-12-03 DIAGNOSIS — R110 Nausea: Secondary | ICD-10-CM | POA: Diagnosis not present

## 2021-12-03 DIAGNOSIS — R5383 Other fatigue: Secondary | ICD-10-CM | POA: Diagnosis not present

## 2021-12-05 DIAGNOSIS — K838 Other specified diseases of biliary tract: Secondary | ICD-10-CM | POA: Diagnosis not present

## 2021-12-05 DIAGNOSIS — B1710 Acute hepatitis C without hepatic coma: Secondary | ICD-10-CM | POA: Diagnosis not present

## 2021-12-05 DIAGNOSIS — R112 Nausea with vomiting, unspecified: Secondary | ICD-10-CM | POA: Diagnosis not present

## 2021-12-05 DIAGNOSIS — K8020 Calculus of gallbladder without cholecystitis without obstruction: Secondary | ICD-10-CM | POA: Diagnosis not present

## 2021-12-05 DIAGNOSIS — K7200 Acute and subacute hepatic failure without coma: Secondary | ICD-10-CM | POA: Diagnosis not present

## 2021-12-05 DIAGNOSIS — K759 Inflammatory liver disease, unspecified: Secondary | ICD-10-CM | POA: Diagnosis not present

## 2021-12-05 DIAGNOSIS — Z880 Allergy status to penicillin: Secondary | ICD-10-CM | POA: Diagnosis not present

## 2021-12-05 DIAGNOSIS — N281 Cyst of kidney, acquired: Secondary | ICD-10-CM | POA: Diagnosis not present

## 2021-12-08 DIAGNOSIS — M25311 Other instability, right shoulder: Secondary | ICD-10-CM | POA: Diagnosis not present

## 2021-12-08 DIAGNOSIS — Z4789 Encounter for other orthopedic aftercare: Secondary | ICD-10-CM | POA: Diagnosis not present

## 2021-12-08 DIAGNOSIS — M25511 Pain in right shoulder: Secondary | ICD-10-CM | POA: Diagnosis not present

## 2021-12-15 DIAGNOSIS — M25511 Pain in right shoulder: Secondary | ICD-10-CM | POA: Diagnosis not present

## 2021-12-15 DIAGNOSIS — Z4789 Encounter for other orthopedic aftercare: Secondary | ICD-10-CM | POA: Diagnosis not present

## 2021-12-15 DIAGNOSIS — M25311 Other instability, right shoulder: Secondary | ICD-10-CM | POA: Diagnosis not present

## 2022-03-30 DIAGNOSIS — L821 Other seborrheic keratosis: Secondary | ICD-10-CM | POA: Diagnosis not present

## 2022-03-30 DIAGNOSIS — D2262 Melanocytic nevi of left upper limb, including shoulder: Secondary | ICD-10-CM | POA: Diagnosis not present

## 2022-03-30 DIAGNOSIS — D225 Melanocytic nevi of trunk: Secondary | ICD-10-CM | POA: Diagnosis not present

## 2022-03-30 DIAGNOSIS — D2261 Melanocytic nevi of right upper limb, including shoulder: Secondary | ICD-10-CM | POA: Diagnosis not present

## 2022-04-22 DIAGNOSIS — N182 Chronic kidney disease, stage 2 (mild): Secondary | ICD-10-CM | POA: Diagnosis not present

## 2022-04-22 DIAGNOSIS — M109 Gout, unspecified: Secondary | ICD-10-CM | POA: Diagnosis not present

## 2022-04-22 DIAGNOSIS — E039 Hypothyroidism, unspecified: Secondary | ICD-10-CM | POA: Diagnosis not present

## 2022-04-22 DIAGNOSIS — Z125 Encounter for screening for malignant neoplasm of prostate: Secondary | ICD-10-CM | POA: Diagnosis not present

## 2022-04-22 DIAGNOSIS — R7303 Prediabetes: Secondary | ICD-10-CM | POA: Diagnosis not present

## 2022-04-22 DIAGNOSIS — Z13220 Encounter for screening for lipoid disorders: Secondary | ICD-10-CM | POA: Diagnosis not present

## 2022-04-22 DIAGNOSIS — Z0000 Encounter for general adult medical examination without abnormal findings: Secondary | ICD-10-CM | POA: Diagnosis not present

## 2022-04-22 DIAGNOSIS — Z23 Encounter for immunization: Secondary | ICD-10-CM | POA: Diagnosis not present

## 2022-04-22 DIAGNOSIS — J4520 Mild intermittent asthma, uncomplicated: Secondary | ICD-10-CM | POA: Diagnosis not present

## 2022-04-22 DIAGNOSIS — J309 Allergic rhinitis, unspecified: Secondary | ICD-10-CM | POA: Diagnosis not present

## 2022-06-01 DIAGNOSIS — M21622 Bunionette of left foot: Secondary | ICD-10-CM | POA: Diagnosis not present

## 2022-06-01 DIAGNOSIS — M21621 Bunionette of right foot: Secondary | ICD-10-CM | POA: Diagnosis not present

## 2022-06-01 DIAGNOSIS — M79671 Pain in right foot: Secondary | ICD-10-CM | POA: Diagnosis not present

## 2022-06-01 DIAGNOSIS — M79672 Pain in left foot: Secondary | ICD-10-CM | POA: Diagnosis not present

## 2022-06-03 DIAGNOSIS — N401 Enlarged prostate with lower urinary tract symptoms: Secondary | ICD-10-CM | POA: Diagnosis not present

## 2022-06-03 DIAGNOSIS — R3911 Hesitancy of micturition: Secondary | ICD-10-CM | POA: Diagnosis not present

## 2022-06-03 DIAGNOSIS — E349 Endocrine disorder, unspecified: Secondary | ICD-10-CM | POA: Diagnosis not present

## 2022-07-13 DIAGNOSIS — R3911 Hesitancy of micturition: Secondary | ICD-10-CM | POA: Diagnosis not present

## 2022-07-13 DIAGNOSIS — N401 Enlarged prostate with lower urinary tract symptoms: Secondary | ICD-10-CM | POA: Diagnosis not present

## 2022-08-21 DIAGNOSIS — R3911 Hesitancy of micturition: Secondary | ICD-10-CM | POA: Diagnosis not present

## 2022-08-21 DIAGNOSIS — N401 Enlarged prostate with lower urinary tract symptoms: Secondary | ICD-10-CM | POA: Diagnosis not present

## 2022-09-10 DIAGNOSIS — R3915 Urgency of urination: Secondary | ICD-10-CM | POA: Diagnosis not present

## 2022-12-30 DIAGNOSIS — N400 Enlarged prostate without lower urinary tract symptoms: Secondary | ICD-10-CM | POA: Diagnosis not present

## 2023-01-04 DIAGNOSIS — Z8709 Personal history of other diseases of the respiratory system: Secondary | ICD-10-CM | POA: Diagnosis not present

## 2023-01-04 DIAGNOSIS — E039 Hypothyroidism, unspecified: Secondary | ICD-10-CM | POA: Diagnosis not present

## 2023-01-04 DIAGNOSIS — Z79899 Other long term (current) drug therapy: Secondary | ICD-10-CM | POA: Diagnosis not present

## 2023-01-04 DIAGNOSIS — E298 Other testicular dysfunction: Secondary | ICD-10-CM | POA: Diagnosis not present

## 2023-01-04 DIAGNOSIS — Z8619 Personal history of other infectious and parasitic diseases: Secondary | ICD-10-CM | POA: Diagnosis not present

## 2023-01-04 DIAGNOSIS — I1A0 Resistant hypertension: Secondary | ICD-10-CM | POA: Diagnosis not present
